# Patient Record
Sex: Female | Born: 1950
Health system: Southern US, Community
[De-identification: ages and names within clinical notes are randomized; demographics above are authoritative.]

## PROBLEM LIST (undated history)

## (undated) DIAGNOSIS — I7300 Raynaud's syndrome without gangrene: Secondary | ICD-10-CM

## (undated) DIAGNOSIS — E041 Nontoxic single thyroid nodule: Secondary | ICD-10-CM

## (undated) HISTORY — PX: DILATION AND CURETTAGE OF UTERUS: SHX78

## (undated) HISTORY — DX: Raynaud's syndrome without gangrene: I73.00

## (undated) HISTORY — DX: Nontoxic single thyroid nodule: E04.1

---

## 1975-09-05 HISTORY — PX: TONSILLECTOMY: SUR1361

## 1986-09-04 HISTORY — PX: TUBAL LIGATION: SHX77

## 1987-09-05 HISTORY — PX: BREAST ENHANCEMENT SURGERY: SHX7

## 1998-04-20 ENCOUNTER — Other Ambulatory Visit: Admission: RE | Admit: 1998-04-20 | Discharge: 1998-04-20 | Payer: Self-pay | Admitting: Obstetrics & Gynecology

## 1998-09-02 ENCOUNTER — Other Ambulatory Visit: Admission: RE | Admit: 1998-09-02 | Discharge: 1998-09-02 | Payer: Self-pay | Admitting: Internal Medicine

## 1999-05-05 ENCOUNTER — Other Ambulatory Visit: Admission: RE | Admit: 1999-05-05 | Discharge: 1999-05-05 | Payer: Self-pay | Admitting: Obstetrics & Gynecology

## 1999-07-15 ENCOUNTER — Ambulatory Visit (HOSPITAL_COMMUNITY): Admission: RE | Admit: 1999-07-15 | Discharge: 1999-07-15 | Payer: Self-pay | Admitting: Obstetrics & Gynecology

## 1999-07-15 ENCOUNTER — Encounter (INDEPENDENT_AMBULATORY_CARE_PROVIDER_SITE_OTHER): Payer: Self-pay

## 1999-11-11 ENCOUNTER — Encounter: Payer: Self-pay | Admitting: Obstetrics & Gynecology

## 1999-11-11 ENCOUNTER — Encounter: Admission: RE | Admit: 1999-11-11 | Discharge: 1999-11-11 | Payer: Self-pay | Admitting: Obstetrics & Gynecology

## 2000-08-23 ENCOUNTER — Other Ambulatory Visit: Admission: RE | Admit: 2000-08-23 | Discharge: 2000-08-23 | Payer: Self-pay | Admitting: Obstetrics & Gynecology

## 2001-10-25 ENCOUNTER — Encounter: Admission: RE | Admit: 2001-10-25 | Discharge: 2001-10-25 | Payer: Self-pay | Admitting: Obstetrics & Gynecology

## 2001-10-25 ENCOUNTER — Encounter: Payer: Self-pay | Admitting: Obstetrics & Gynecology

## 2001-12-09 ENCOUNTER — Other Ambulatory Visit: Admission: RE | Admit: 2001-12-09 | Discharge: 2001-12-09 | Payer: Self-pay | Admitting: Obstetrics & Gynecology

## 2004-08-31 ENCOUNTER — Inpatient Hospital Stay (HOSPITAL_COMMUNITY): Admission: AD | Admit: 2004-08-31 | Discharge: 2004-08-31 | Payer: Self-pay | Admitting: Obstetrics & Gynecology

## 2004-10-04 ENCOUNTER — Other Ambulatory Visit: Admission: RE | Admit: 2004-10-04 | Discharge: 2004-10-04 | Payer: Self-pay | Admitting: Obstetrics & Gynecology

## 2009-04-06 ENCOUNTER — Encounter (INDEPENDENT_AMBULATORY_CARE_PROVIDER_SITE_OTHER): Payer: Self-pay | Admitting: Interventional Radiology

## 2009-04-06 ENCOUNTER — Encounter: Admission: RE | Admit: 2009-04-06 | Discharge: 2009-04-06 | Payer: Self-pay | Admitting: Surgery

## 2009-04-06 ENCOUNTER — Other Ambulatory Visit: Admission: RE | Admit: 2009-04-06 | Discharge: 2009-04-06 | Payer: Self-pay | Admitting: Interventional Radiology

## 2009-07-20 ENCOUNTER — Encounter: Admission: RE | Admit: 2009-07-20 | Discharge: 2009-07-20 | Payer: Self-pay | Admitting: Obstetrics & Gynecology

## 2009-10-05 HISTORY — PX: ENDOMETRIAL ABLATION: SHX621

## 2009-11-18 ENCOUNTER — Encounter: Admission: RE | Admit: 2009-11-18 | Discharge: 2009-11-18 | Payer: Self-pay | Admitting: Surgery

## 2010-05-28 IMAGING — US US SOFT TISSUE HEAD/NECK
1 series · 14 of 25 positions shown · non-contrast
Comparison: None

CLINICAL DATA: Palpable left thyroid nodule.

THYROID ULTRASOUND
TECHNIQUE: Ultrasound examination of the thyroid gland and
adjacent soft tissues was performed.

[Series 1: us soft tissue head/neck · 0.04mm/px · 14 of 39 slices shown]
[im 1/39]
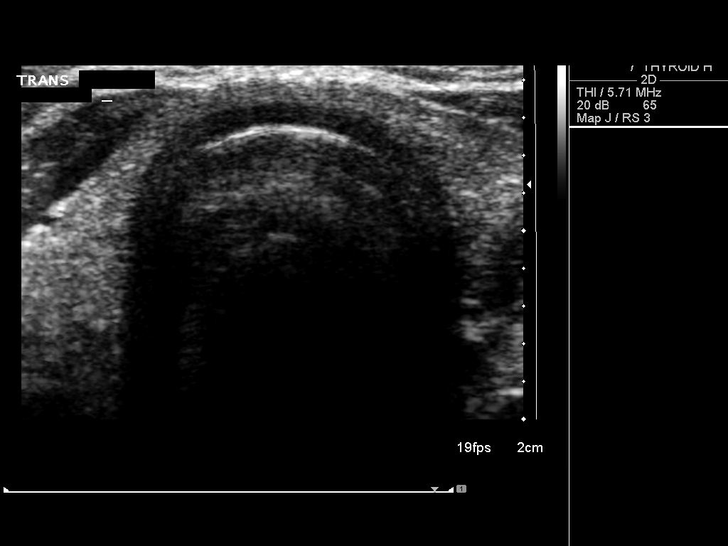
[im 4/39]
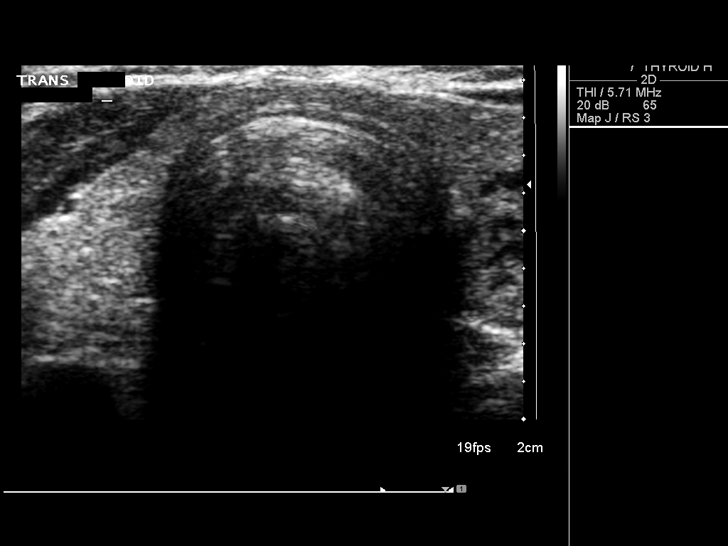
[im 7/39]
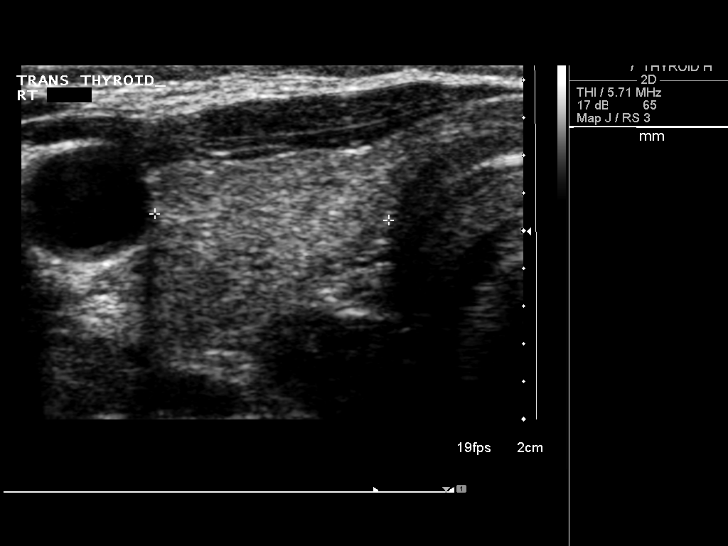
[im 10/39]
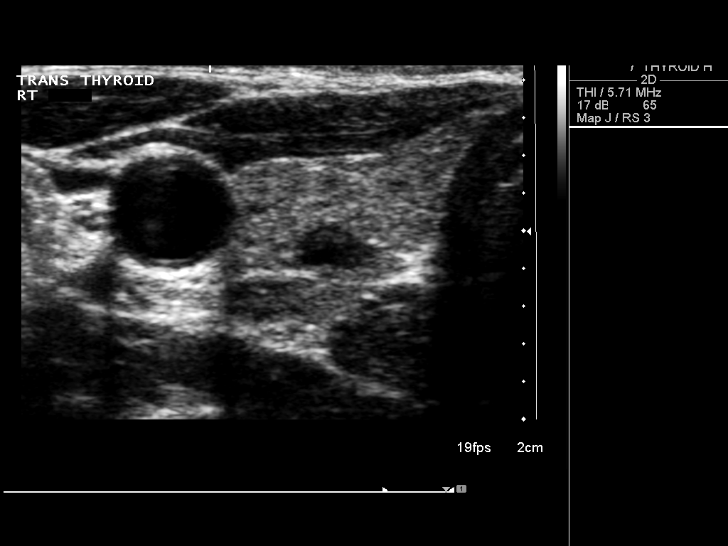
[im 13/39]
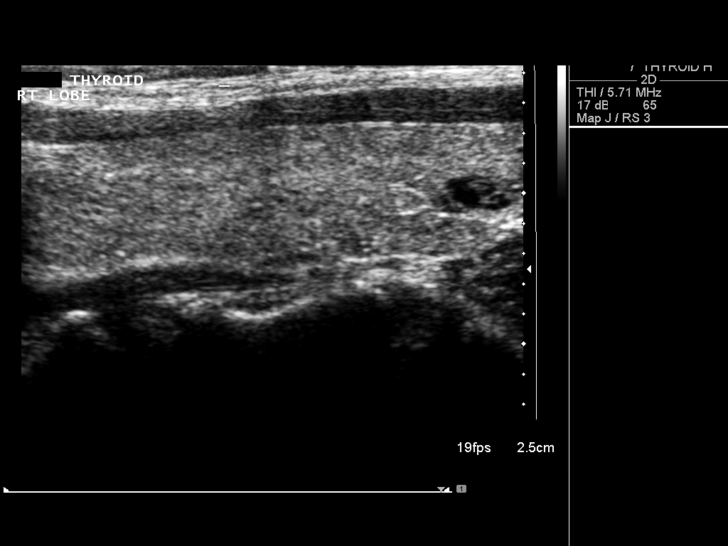
[im 15/39]
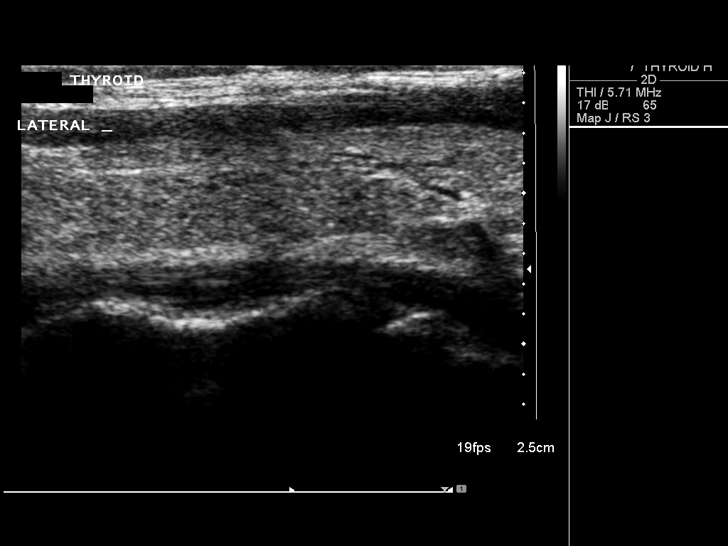
[im 18/39]
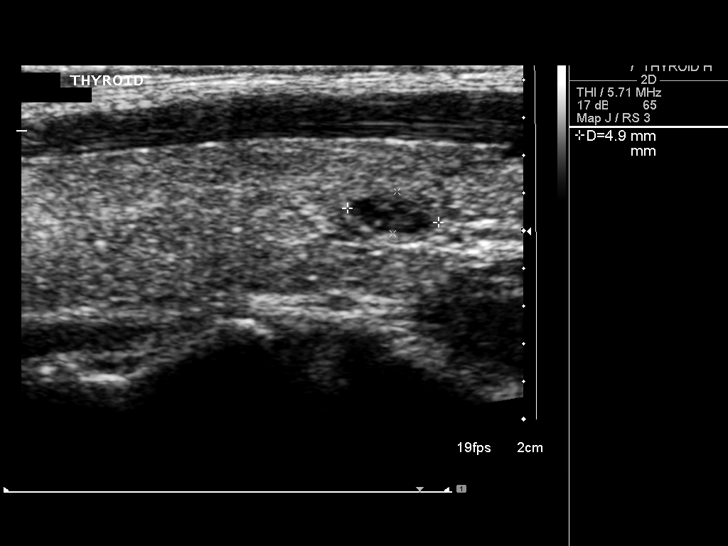
[im 21/39]
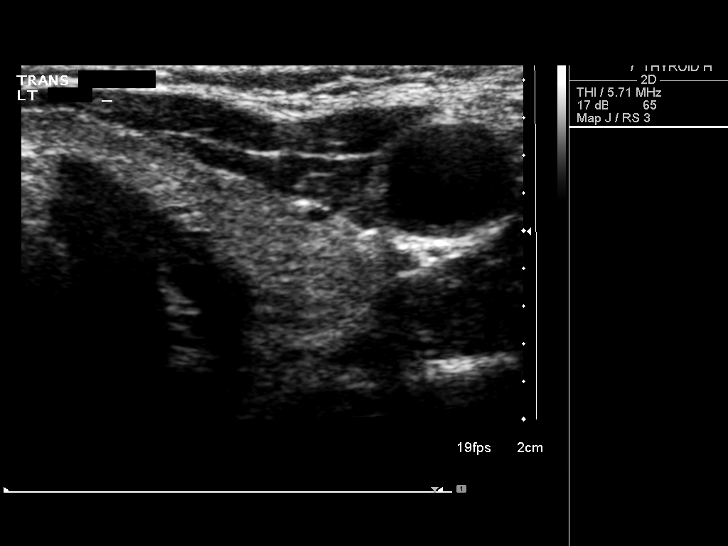
[im 24/39]
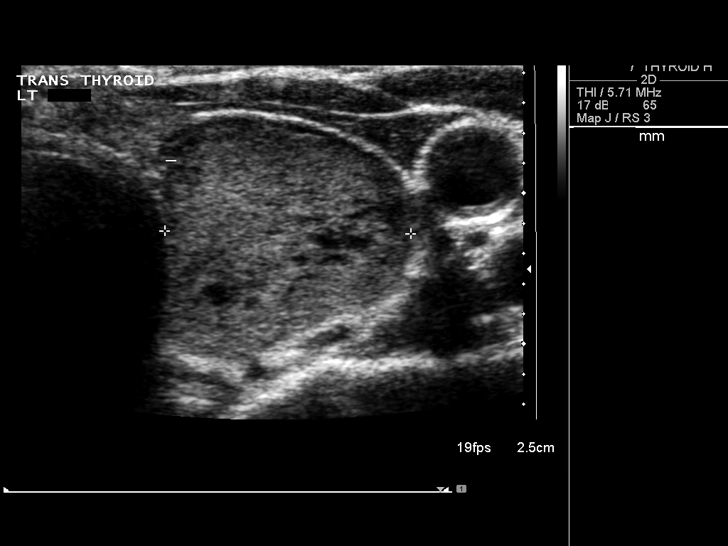
[im 26/39]
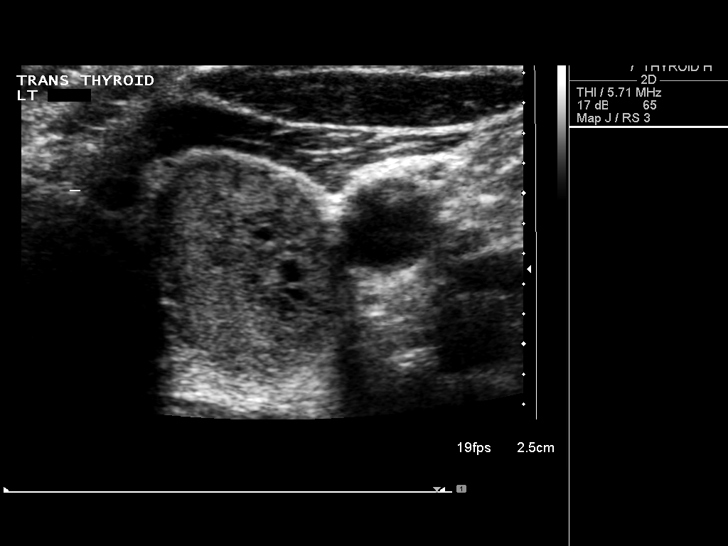
[im 29/39]
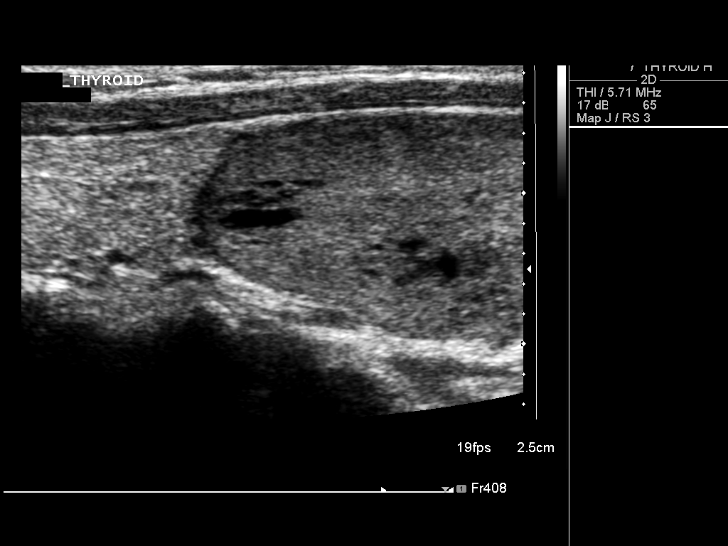
[im 32/39]
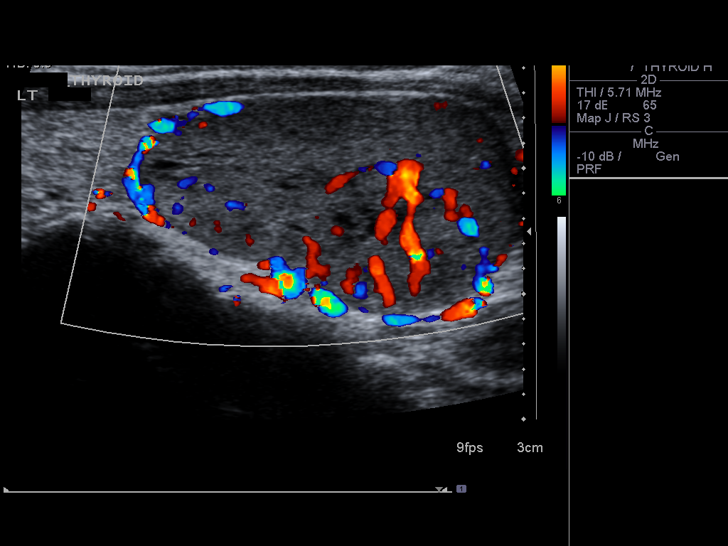
[im 35/39]
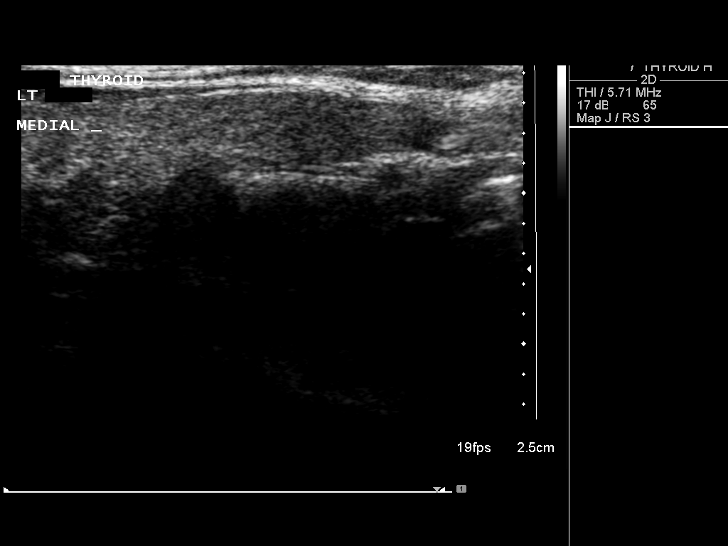
[im 39/39]
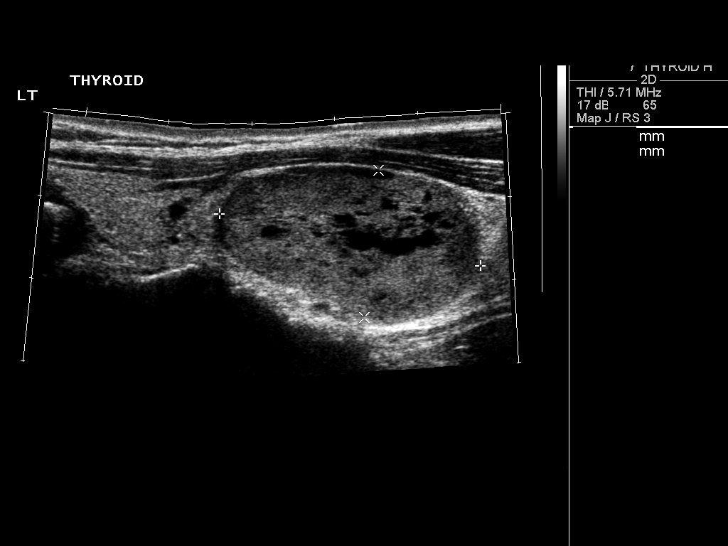

[14 of 25 positions shown; findings below may reference images not displayed]

FINDINGS: Right lobe:  4.9 x 1.1 x 1.2 cm.

Left lobe:  5.1 x 1.8 x 1.8 cm.

Thyroid isthmus:  0.2 cm.

A dominant ovoid nodule is present in the inferior aspect of the
left lobe measuring approximately 3.2 x 1.8 x 1.8 cm.  This nodule
is predominately solid and contains some areas of internal partial
cystic degeneration.  No internal microcalcifications are
identified.  Hypervascular color Doppler flow is seen around the
periphery of the nodule.

A tiny 5 mm hypoechoic nodules present in the inferior aspect of
the right lobe.
IMPRESSION: Dominant thyroid nodule in left lobe measuring 3.2 x 1.8 x 1.8 cm.
This nodule is predominately solid with some areas of internal
cystic degeneration present.

## 2010-05-28 IMAGING — US US BIOPSY
1 series · 5 of 5 positions shown · non-contrast
Comparison: Diagnostic ultrasound performed on same date

CLINICAL DATA: Dominant solid nodule in left lobe of thyroid gland.

ULTRASOUND-GUIDED NEEDLE ASPIRATE BIOPSY, LEFT LOBE OF THYROID
The above procedure was discussed with the patient and written
informed consent was obtained.

[Series 1: us biopsy · 5 acquisitions, 5 frames shown]
[im 1/5]
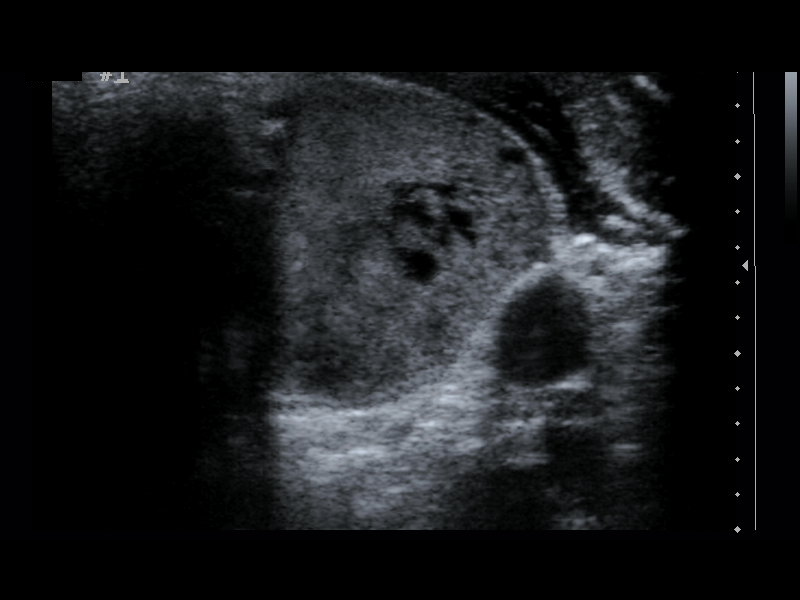
[im 2/5]
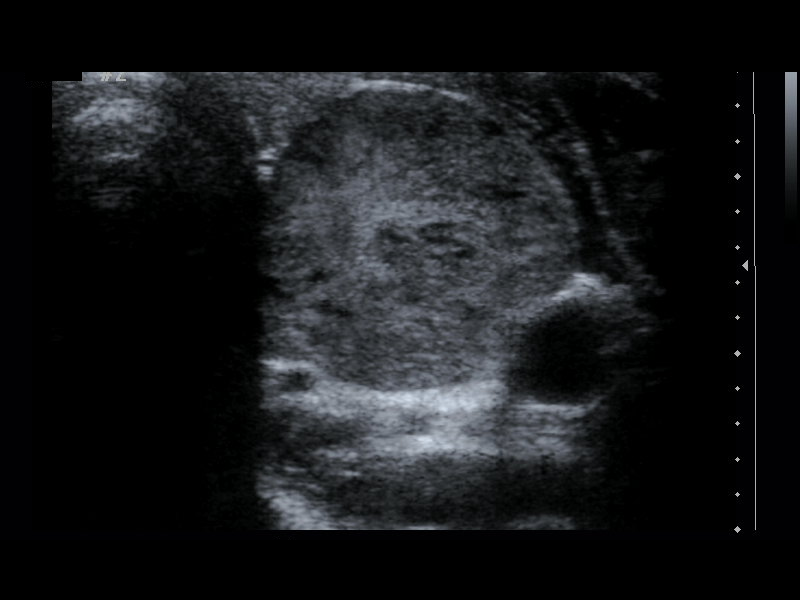
[im 3/5]
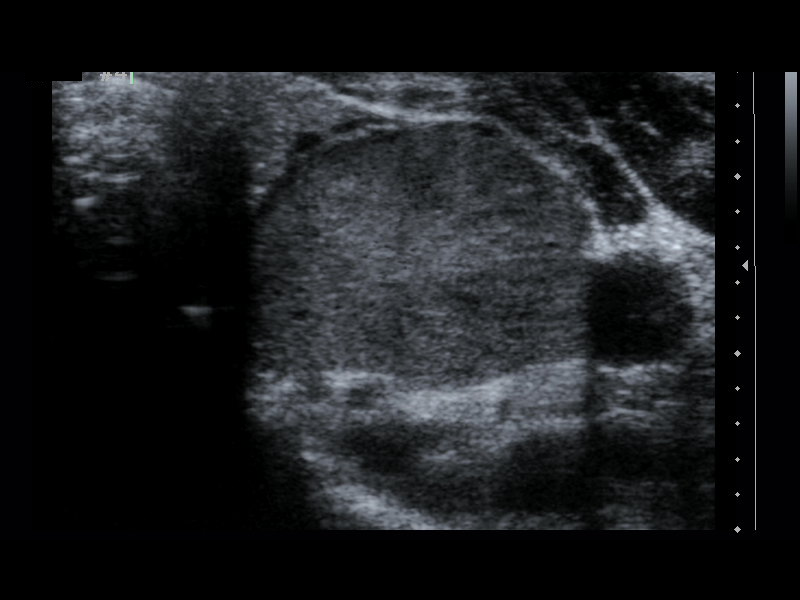
[im 4/5]
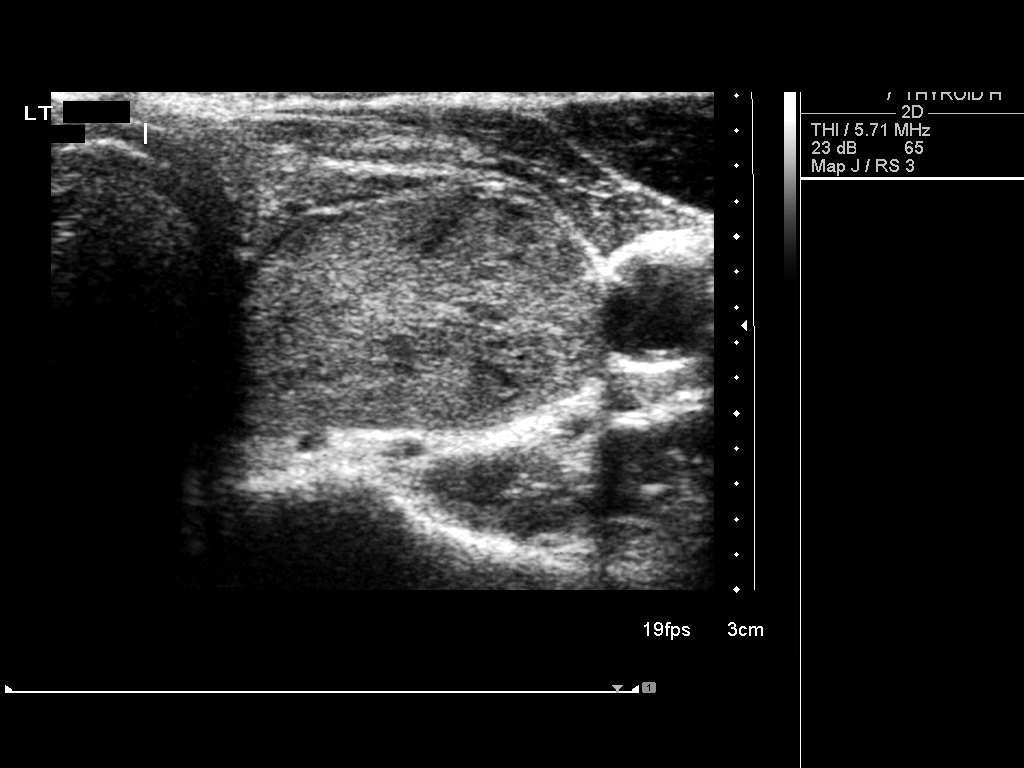
[im 5/5]
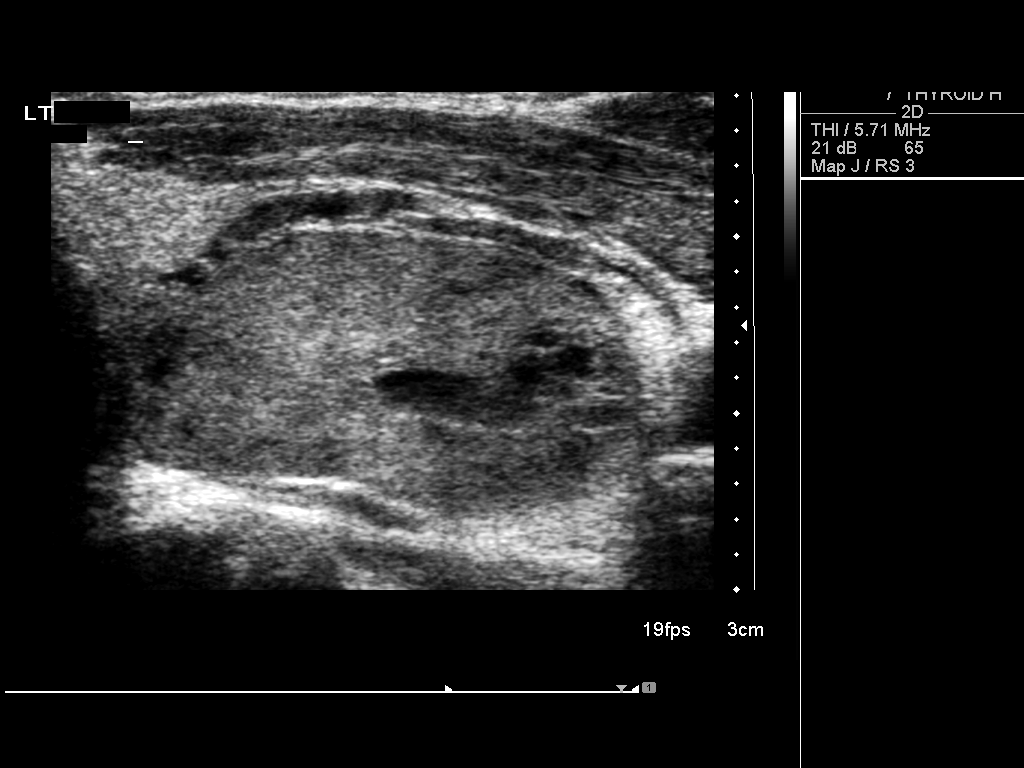

[5 of 5 positions shown; findings below may reference images not displayed]

FINDINGS: Ultrasound was performed to localize and mark an adequate
site for the biopsy.  The patient was then prepped and draped in a
normal sterile fashion.  Local anesthesia was provided with 1%
lidocaine.  Using direct ultrasound guidance, 4 passes were made
using 25 gauge needles into the nodule within the left lobe of the
thyroid.  Ultrasound was used to confirm needle placements on all
occasions.  Specimens were sent to Pathology for analysis.  Post
procedural imaging demonstrated no hematoma or immediate
complication.  The patient tolerated the procedure well.
IMPRESSION: Ultrasound guided needle aspirate biopsy performed of left thyroid
nodule.

## 2011-01-20 NOTE — Op Note (Signed)
[facility]  Patient:    Julie Andersen                        MRN: [id number] Proc. Date: 07/15/99 Adm. Date:  [id number] Attending:  Minette Headland                           Operative Report  PREOPERATIVE DIAGNOSES:       Menometrorrhagia; dysfunctional uterine bleeding unresponsive to hormonal therapy; uterine enlargement.  POSTOPERATIVE DIAGNOSES:      Menometrorrhagia; dysfunctional uterine bleeding unresponsive to hormonal therapy; normal-size uterine cavity and no evidence of  intraoperative or intrauterine of endometrial abnormality.  OPERATIVE PROCEDURE:          Hysteroscopy; dilatation and curettage.  SURGEON:                      Freddy Finner, M.D.  ANESTHESIA:                   Managed intravenous sedation and paracervical block.  ESTIMATED INTRAOPERATIVE BLOOD LOSS:  20 cc.  INTRAOPERATIVE SORBITOL DEFICIT:  50 cc.  INDICATIONS:                  Patient is a 60 year old white married female, gravida 2, para 2, who has had several months history of severe menorrhagia and  intermenstrual spotting.  She has been treated with oral contraceptives in elevated doses with some decrease in flow but without resolution of bleeding.  She is now admitted for a hysteroscopy/D&C.  INTRAOPERATIVE FINDINGS:      The uterus sounded to 8 cm.  There was no intracavitary abnormality.  The overall size of the uterus is approximately 9 weeks.  DESCRIPTION OF PROCEDURE:     Patient was admitted on the morning of surgery, brought to the operating room and there placed under adequate intravenous sedation. She was placed in the dorsal lithotomy position using the [employer] system.  Betadine prep of perineum and vagina was carried out.  Sterile drapes were applied. Bivalve speculum was introduced and cervix was anesthetized on the anterior lip  with 0.5 cc of 1% plain lidocaine.  Cervix was grasped with a single-tooth tenaculum.   Paracervical block was placed using approximately 5 cc of 1% plain lidocaine injected at 4 and 8 oclock in the vaginal fornices.  Cervix and uterus were sounded to 8 cm.  Progressive dilatation of the cervix was accomplished with Pratts to 23.  [facility] diagnostic scope was then introduced, using sorbitol as the distending medium.  No visible evidence of intracavitary abnormality was identified.  Thorough curettage with a Heaney curette and exploration with Randall stone forceps were used to collect the endometrial sample.  Reinspection revealed no new findings.  The procedure was terminated at this point.  All instruments ere removed.  The patient was taken to recovery in good condition.  She will be discharged with routine outpatient surgical instructions, for followup in the office in one week.  She is to call for fever, severe pain or heavy bleeding.  She is to take Vicoprofen as needed for postoperative pain.  She is o continue with [facility]. DD:  07/15/99 TD:  07/15/99 Job: [id number]

## 2011-06-28 ENCOUNTER — Telehealth: Payer: Self-pay | Admitting: Pulmonary Disease

## 2011-06-28 NOTE — Telephone Encounter (Signed)
Received 280 pages from [facility]. Forwarded to Dr. Kriste Basque for review. 06/28/11-ar

## 2011-07-24 ENCOUNTER — Encounter: Payer: Self-pay | Admitting: Pulmonary Disease

## 2011-07-24 ENCOUNTER — Ambulatory Visit (INDEPENDENT_AMBULATORY_CARE_PROVIDER_SITE_OTHER)
Admission: RE | Admit: 2011-07-24 | Discharge: 2011-07-24 | Disposition: A | Discharge status: Home / Self Care | Payer: PRIVATE HEALTH INSURANCE | Source: Ambulatory Visit | Attending: Pulmonary Disease | Admitting: Pulmonary Disease

## 2011-07-24 ENCOUNTER — Ambulatory Visit (INDEPENDENT_AMBULATORY_CARE_PROVIDER_SITE_OTHER): Payer: PRIVATE HEALTH INSURANCE | Admitting: Pulmonary Disease

## 2011-07-24 VITALS — BP 124/68 | HR 58 | Temp 97.0°F | Ht 68.0 in | Wt 120.6 lb

## 2011-07-24 DIAGNOSIS — K635 Polyp of colon: Secondary | ICD-10-CM | POA: Insufficient documentation

## 2011-07-24 DIAGNOSIS — Z0000 Encounter for general adult medical examination without abnormal findings: Secondary | ICD-10-CM

## 2011-07-24 DIAGNOSIS — K5730 Diverticulosis of large intestine without perforation or abscess without bleeding: Secondary | ICD-10-CM

## 2011-07-24 DIAGNOSIS — Z8639 Personal history of other endocrine, nutritional and metabolic disease: Secondary | ICD-10-CM | POA: Insufficient documentation

## 2011-07-24 DIAGNOSIS — I7300 Raynaud's syndrome without gangrene: Secondary | ICD-10-CM

## 2011-07-24 DIAGNOSIS — Z862 Personal history of diseases of the blood and blood-forming organs and certain disorders involving the immune mechanism: Secondary | ICD-10-CM

## 2011-07-24 DIAGNOSIS — D126 Benign neoplasm of colon, unspecified: Secondary | ICD-10-CM

## 2011-07-24 DIAGNOSIS — M79609 Pain in unspecified limb: Secondary | ICD-10-CM

## 2011-07-24 DIAGNOSIS — H9190 Unspecified hearing loss, unspecified ear: Secondary | ICD-10-CM | POA: Insufficient documentation

## 2011-07-24 DIAGNOSIS — E538 Deficiency of other specified B group vitamins: Secondary | ICD-10-CM | POA: Insufficient documentation

## 2011-07-24 DIAGNOSIS — M79604 Pain in right leg: Secondary | ICD-10-CM | POA: Insufficient documentation

## 2011-07-24 NOTE — Progress Notes (Signed)
Subjective:    Patient ID: Julie Andersen, female    DOB: 20-Jul-1951, 60 y.o.   MRN: [id number]  HPI   << Julie Andersen >>  26 y/o WF who is the wife of Julie Andersen. 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  ~  July 24, 2011:  Presents for new pt eval and CPX: her CC= pain in her right hip & burning discomfort in her right leg for several months that seems to be persistent & sl progressive; exam shows some sl decr ROM in right hip but neg SLR & neuro-vasc exam is wnl; she will need XRays of hip & lumbar spine to start & likely an MRI of her L/S spine and she would like to pursue this via Ortho (she has seen Julie Andersen yrs ago); NOT: she fell down some stairs in 1993 w/ fx of sacrum & had some similar sounding symptoms at that time; she has also had a Neuro work up more recently from [facility] due to leg symptoms...    Old records reviewed & summarized in the prob list below >>          Problem List:   HEARING LOSS>> she saw Julie Andersen 6/10 w/ hearing loss & they continue to follow  THYROID NODULE>> followed by Julie Andersen for CCS on SYNTHROID 10mcg/d for suppression. ~  Thyroid Sonar 8/10 showed dominant thyroid nodule in left lobe measuring 3.2 x 1.8 x 1.8 cm- predominately solid with some areas of internal cystic degeneration. ~  Needle Biospy 8/10 showed FOLLICULAR EPITHELIUM IN A PREDOMINANTLY MICROFOLLICULAR PATTERN. ~  ThyroidSonar 3/11 showed slight decrease in size of dominant solid nodule in the lower pole of the left lobe... ~  TFTs 11/12 done at her work  Hx GASTRITIS>> she had an EGD by Julie Andersen 1/06 for epigastric symptoms & it showed antritis & duodenitis, neg CLO, treated w/ PPI meds...  COLON POLYPS>> she has had colonoscopies every 54yrs due to hx adenomatous polyps... ~  Last colonoscopy 10/11 by Julie Andersen showed divertics, hemorrhoids, & 4 diminutive polyps==> tubular adenomas  GYN>> followed by Julie Andersen w/ 2 pregnancies, hx fibroids,  ovarian cyst, endometrial polyps...  PAIN in RIGHT HIP & BURNING in RIGHT LEG>> she notes this is a relatively new symptom for last few months, right hip hurts to roll over at night, and burning discomfort all the way down her leg- this appears to be persistant & sl progressive, denies back or buttock discomfort ==> she will need XRays of hip & Lumbar spine to start, and likely need MRI L/S spine for further evaluation (she will decide re: calling Julie Andersen et al for this work-up)... ~  [facility]ail records from 1993 show that she fractured her sacrum after a fall down some steps & she had some tingling in her legs at that time  ?Hx SPINAL STENOSIS>> she reports remote Ortho eval by Julie Andersen w/ Dx of spinal stenosis- but records showed XRays for LBP eval 1992 w/ some degenerative changes w/ narrowing at L4-5 & L5-S1 & he diagnosed a lumbar strain, treated w/ anti-inflamm & musc relaxers & improved.  EPISODE of FALLING>> unexplained fall (leg collapsed) 3/11 & evaluated by Julie Andersen & pt relates that he found low B12 level (around 100 by her recollection) & decided to treat her w/ B12 shots which she says she stopped about in the summer of 2012 (we do not have notes from Julie Andersen to review); she will contact his Andersen regarding restart of the B12 injections.Marland KitchenMarland Kitchen  Hx RAYNAUD's PHENOMENON>> she saw Julie Andersen 10/09 for Rheum & collagen-vasc screen was essentially neg; he has treated w/ CCB in the winter months if needed... ~ she saw Julie Andersen in 1992 w/ concern for Raynaud's in relation to her silicone breast implants & Rx w/ Nifedipine- but developed HAs on this med...  ACNE ROSACEA>> on DOXYCYCLINE per Dr. Amy Andersen...  Hx B12 DEFICIENCY>> prev eval by Julie Andersen w/ low B12 found & she was treated w/ B12 shots (initially weekly, then monthly, then every other month- but she stopped several months ago)... ~  Labs done by Julie Andersen at work 11/12 showed B12 level = 371 (211-911)  HEALTH MAINTENANCE: ~  General Med:   She will get CBC & CMet at work; FLP 11/12 is ok w/ TChol 178, TG 70, HDL 83, LDL 81 ~  GI:  Julie Andersen>  ~  GYN:  Julie Andersen> she gets her mammograms regularly & BMDs have all been WNL (last 2/12 w/ TScores +3.1 in spine, and +2.7 in left FemNeck. ~  Immunizations:  She is up to date w/ Flu shots & TDAP thru Julie Andersen; we discussed the Shingle's vaccine & she will decide; she will be due for the Pneumonia vaccine at age 76...   Past Surgical History  Procedure Date  . Tubal ligation 1988  . Breast enhancement surgery 1989  . Tonsillectomy 1977  . Dilation and curettage of uterus     x 3  . Endometrial ablation 10/2009    Outpatient Encounter Prescriptions as of 07/24/2011  Medication Sig Dispense Refill  . doxycycline (VIBRAMYCIN) 100 MG capsule Take 100 mg by mouth 2 (two) times daily.        Marland Kitchen estradiol-norethindrone (COMBIPATCH) 0.05-0.14 MG/DAY Place 1 patch onto the skin 2 (two) times a week.        . levothyroxine (SYNTHROID, LEVOTHROID) 75 MCG tablet Take 75 mcg by mouth daily.          No Known Allergies   Current Medications, Allergies, Past Medical History, Past Surgical History, Family History, and Social History were reviewed in Julie Andersen record.    Review of Systems    Constitutional:  Denies F/C/S, anorexia, unexpected weight change. HEENT:  No HA, visual changes, earache, nasal symptoms, sore throat, hoarseness. Resp:  No cough, sputum, hemoptysis; no SOB, tightness, wheezing. Cardio:  No CP, palpit, DOE, orthopnea, edema. GI:  Denies N/V/D/C or blood in stool; no reflux, abd pain, distention, or gas. GU:  No dysuria, freq, urgency, hematuria, or flank pain. MS:  +pain in right hip w/ sl decr ROM; neg SLR, neuro intact, no neck or back pain. Neuro:  No tremors, seizures, dizziness, syncope, weakness, numbness, gait abn. Skin:  No suspicious lesions or skin rash. Heme:  No adenopathy, bruising, bleeding. Psyche: Denies confusion, sleep  disturbance, hallucinations, anxiety, depression.   Objective:   Physical Exam    Vital Signs:  Reviewed...  General:  WD, Thin, 60 y/o WF in NAD; alert & oriented; pleasant & cooperative... HEENT:  [place]/AT; Conjunctiva- pink, Sclera- nonicteric, EOM-wnl, PERRLA, EACs-clear, TMs-wnl; NOSE-clear; THROAT-clear & wnl. Neck:  Supple w/ full ROM; no JVD; normal carotid impulses w/o bruits; no thyromegaly & could not palp nodule; no lymphadenopathy. Chest:  Clear to P & A; without wheezes, rales, or rhonchi heard. Heart:  Regular Rhythm; norm S1 & S2 without murmurs, rubs, or gallops detected. Abdomen:  Soft & nontender- no guarding or rebound; normal bowel sounds; no organomegaly or masses palpated. Ext:  Sl  decr ROM right hip; otherw exam neg w/o obvious arthritic changes; no varicose veins, venous insuffic, or edema;  Pulses intact w/o bruits. Neuro:  CNs II-XII intact; motor testing normal; sensory testing normal; gait normal & balance OK. Derm:  No lesions noted; no rash etc, she has hx acne rosacea controlled w/ Doxy... Lymph:  No cervical, supraclavicular, axillary, or inguinal adenopathy palpated.  EKG:  SBrady- rate 54/min, sl IVCD & NSSTTWA, no acute changes & no old tracings for comparison...  CXR:  Heart is normal size, lungs clear, mild degen changes in TSpine w/ lat osteophytes, breast implants w/ capsular calcific...  Pt brought labs from Julie Andersen:  Reviewed w/ pt...   Assessment & Plan:   CPX>  Her CC is the discomfort in her right hip area & burning sensation in her right leg; this needs further evaluation & I suggest we proceed w/ either Ortho eval (eg Julie Andersen) or consider Neuro eval f/u w/ DrLove; she will need XRays of right hip & L/S spine & consideration of additional tests (MRI L/S spine & poss EMG/NCV)...  Hearing loss> Evaluated & followed by Gayla Medicus...  Thyroid Nodule>  Evaluated & followed by Julie Andersen, on Synthroid suppression & nodule was smaller on her  last scan...  GI> Hx Gastritis, Divertics, Polyps, Hems>  Followed by [facility] & up to date on screening...  GYN>  Followed by Julie Andersen & similarly up to date...  ORTHO/ NEURO>> Hx falling w/ sacral fx in the past, right hip discomfort, right leg discomfort, ?"spinal stenosis" in the past> ?etiology & needs further eval as outlined...  Hx Raynaud's>  Eval & Rx from Drs Phylliss Bob & Dareen Piano in the past...  B12 Deficiency> she's been off the B12 shots & recent B12 level = 371; needs f/u from [facility] & we will call for his notes to review.Marland KitchenMarland Kitchen

## 2011-07-24 NOTE — Patient Instructions (Signed)
Julie Andersen, it was nice meeting you today...  Today we did your baseline CXR & EKG...    Please call the [facility] in a few days for your results...    Dial [id number] & when prompted enter your patient number followed by the # symbol...    Your patient number is:  [id number]#  When you next have labs at DrNeal's office please do a CBC w/ diff & CMet (and send copies to me please)...  We should proceed w/ further evaluation of your right hip & leg symptoms via an Orthopedist or w/ DrLove, Neurology.  Let me know if you need any pain meds or if I can be of further assistance in any way.Marland KitchenMarland Kitchen

## 2011-08-25 ENCOUNTER — Telehealth: Payer: Self-pay | Admitting: *Deleted

## 2011-08-25 MED ORDER — LEVOTHYROXINE SODIUM 75 MCG PO TABS: 75.0000 ug | ORAL_TABLET | Freq: Every day | ORAL | Status: AC

## 2011-08-25 NOTE — Telephone Encounter (Signed)
Last ov with SN 11.19.12.  Verified medication requested.  90 days to [facility].

## 2011-09-05 HISTORY — PX: AUGMENTATION MAMMAPLASTY: SUR837

## 2011-10-03 ENCOUNTER — Telehealth: Payer: Self-pay | Admitting: Pulmonary Disease

## 2011-10-03 NOTE — Telephone Encounter (Signed)
Per Leigh she does not recall seeing any forms from this facility regarding the patient. I have LMOM for Julie Andersen to return triage call.

## 2011-10-04 NOTE — Telephone Encounter (Signed)
LMTCB for Julie Andersen 

## 2011-10-06 NOTE — Telephone Encounter (Signed)
lmomtcb for tracy

## 2011-10-09 NOTE — Telephone Encounter (Signed)
I spoke with [employer] and they have received the release on 10-03-11 and it is in process. They have 7-10 business days to complete the requests. I have LMTCBx1 with [facility] to advise. .sing

## 2011-10-10 NOTE — Telephone Encounter (Signed)
lmomtcb x 1 for marilyn at [facility].

## 2011-10-11 NOTE — Telephone Encounter (Signed)
lmomtcb x 1 for Julie Andersen or [employer]. [facility]

## 2011-10-12 NOTE — Telephone Encounter (Signed)
Per protocol I will sign off on note and await call back. Carron Curie, CMA

## 2011-11-22 ENCOUNTER — Telehealth: Payer: Self-pay | Admitting: Pulmonary Disease

## 2011-11-22 NOTE — Telephone Encounter (Signed)
I spoke with pt and she states that she is going to have her breast implants replaced and the surgery is scheduled for 12/21/11 by a doctor in [place]. She could not remember his name though. Pt is needing to have surgery clearance. 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 Please advise Dr. Kriste Basque, thanks

## 2011-11-22 NOTE — Telephone Encounter (Signed)
Called and lmomtcb for pt ---offered appt for surgical clearance 12-12-11 at 12.

## 2011-11-23 NOTE — Telephone Encounter (Signed)
Spoke with pt and scheduled her appt for 12-12-11 at 12. Nothing further needed per pt.

## 2011-12-12 ENCOUNTER — Encounter: Payer: Self-pay | Admitting: Pulmonary Disease

## 2011-12-12 ENCOUNTER — Ambulatory Visit (INDEPENDENT_AMBULATORY_CARE_PROVIDER_SITE_OTHER): Payer: PRIVATE HEALTH INSURANCE | Admitting: Pulmonary Disease

## 2011-12-12 VITALS — BP 100/62 | HR 50 | Temp 97.0°F | Ht 68.0 in | Wt 120.4 lb

## 2011-12-12 DIAGNOSIS — K5730 Diverticulosis of large intestine without perforation or abscess without bleeding: Secondary | ICD-10-CM

## 2011-12-12 DIAGNOSIS — I7300 Raynaud's syndrome without gangrene: Secondary | ICD-10-CM

## 2011-12-12 DIAGNOSIS — Z01818 Encounter for other preprocedural examination: Secondary | ICD-10-CM

## 2011-12-12 DIAGNOSIS — Z8639 Personal history of other endocrine, nutritional and metabolic disease: Secondary | ICD-10-CM

## 2011-12-12 DIAGNOSIS — Z862 Personal history of diseases of the blood and blood-forming organs and certain disorders involving the immune mechanism: Secondary | ICD-10-CM

## 2011-12-12 DIAGNOSIS — K635 Polyp of colon: Secondary | ICD-10-CM

## 2011-12-12 DIAGNOSIS — D126 Benign neoplasm of colon, unspecified: Secondary | ICD-10-CM

## 2011-12-12 DIAGNOSIS — E538 Deficiency of other specified B group vitamins: Secondary | ICD-10-CM

## 2011-12-12 NOTE — Progress Notes (Signed)
Subjective:    Patient ID: Julie Andersen, female    DOB: 08-May-1951, 61 y.o.   MRN: [id number]  HPI << Julie Andersen >> 60 y/o WF who is the wife of Julie Andersen. 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  ~  July 24, 2011:  Presents for new pt eval and CPX: her CC= pain in her right hip & burning discomfort in her right leg for several months that seems to be persistent & sl progressive; exam shows some sl decr ROM in right hip but neg SLR & neuro-vasc exam is wnl; she will need XRays of hip & lumbar spine to start & likely an MRI of her L/S spine and she would like to pursue this via Ortho (she has seen Julie Andersen yrs ago);  Note> she fell down some stairs in 1993 w/ fx of sacrum & had some similar sounding symptoms at that time; she has also had a Neuro work up more recently from [facility] due to leg symptoms...    Old records reviewed & summarized in the prob list below >> LABS 11/12 from her office:  FLP showed TChol 178, TG 70, HDL 83, LDL 81;  Thyroid panel was WNL w/ TSH=1.05;  VitB12=371;  VitD=23 & asked to supplement VitD OTC...  ~  December 12, 2011:  65mo ROV & Julie Andersen has been doing quite well, here today for pre-op med clearance as she is sched for breast implant replacements by Dr. Ina Andersen in [place] next week;  Julie Andersen daughter is a young Programmer, systems in that same practice;  She tells me that one of her saline&gel implants ruptured several yrs ago and she is going to have them replaced at this time...  Medically she is doing well- no new complaints or concerns & she denies CP, palpit, dizzy/ lightheaded, SOB, edema, etc;  prob list as noted below abstracted from old records supplied during her initial visit w/ [place]...           Problem List:     PROBLEM LIST UPDATED 12/12/11 >>  HEARING LOSS>> she saw Julie Andersen (ENT) 6/10 w/ hearing loss & they continue to follow...  THYROID NODULE>> followed by Julie Andersen for CCS on SYNTHROID  94mcg/d for suppression. ~  Thyroid Sonar 8/10 showed dominant thyroid nodule in left lobe measuring 3.2 x 1.8 x 1.8 cm- predominately solid with some areas of internal cystic degeneration. ~  Needle Biospy 8/10 showed FOLLICULAR EPITHELIUM IN A PREDOMINANTLY MICROFOLLICULAR PATTERN. ~  ThyroidSonar 3/11 showed slight decrease in size of dominant solid nodule in the lower pole of the left lobe... ~  TFTs 11/12 done at her work & TSH=1.05, & full thyroid panel normal as well...  Hx GASTRITIS>> she had an EGD by [facility] 1/06 for epigastric symptoms & it showed antritis & duodenitis, neg CLO, treated w/ PPI meds...  COLON POLYPS>> she has had colonoscopies every 1yrs due to hx adenomatous polyps... ~  Last colonoscopy 10/11 by [facility] showed divertics, hemorrhoids, & 4 diminutive polyps==> tubular adenomas  GYN>> followed by Julie Andersen w/ 2 pregnancies, hx fibroids, ovarian cyst, endometrial polyps...  PAIN in RIGHT HIP & BURNING in RIGHT LEG>> she notes this is a relatively new symptom for last few months, right hip hurts to roll over at night, and burning discomfort all the way down her leg- this appears to be persistant & sl progressive, denies back or buttock discomfort ==> she will need XRays of hip & Lumbar spine  to start, and likely need MRI L/S spine for further evaluation (she will decide re: calling Julie Andersen et al for this work-up)... ~  [facility] records from 1993 show that she fractured her sacrum after a fall down some steps & she had some tingling in her legs at that time; Neuro eval by Julie Andersen in 2011 showed no focal neuro deficits...  ?Hx SPINAL STENOSIS>> she reports remote Ortho eval by Julie Andersen w/ Dx of spinal stenosis- but records showed XRays for LBP eval 1992 w/ some degenerative changes w/ narrowing at L4-5 & L5-S1 & he diagnosed a lumbar strain, treated w/ anti-inflamm & musc relaxers & improved. ~  Neurosurg eval 12/12 by Julie Andersen showed scoliosis & L3-4 retrolisthesis & DDD at  L5-S1 on XRays & he wanted to do an MRI but pt tells me she improved w/ her own exercise program & never required formal PT etc so she cancelled the MRI & feels she is doing satis at present on her exercise program (nubness resolved etc)...  EPISODE of FALLING>> unexplained fall (leg collapsed) 3/11 & evaluated by Julie Andersen & pt relates that he found low B12 level (it was 153)) & decided to treat her w/ B12 shots which she says she stopped about in the summer of 2012; she will contact his office regarding restart of the B12 injections...  ~  No further episodes of falling reported...  Hx RAYNAUD's PHENOMENON>> she saw Julie Andersen 10/09 for Rheum & collagen-vasc screen was essentially neg; he has treated w/ CCB in the winter months if needed... ~ she saw Julie Andersen in 1992 w/ concern for Raynaud's in relation to her silicone breast implants & Rx w/ Nifedipine- but developed HAs on this med...  ACNE ROSACEA>> on DOXYCYCLINE per Julie Andersen...  Hx B12 DEFICIENCY>> prev eval by Julie Andersen w/ low B12 found & she was treated w/ B12 shots (initially weekly, then monthly, then every other month- but she stopped several months ago)... ~  Labs from [facility] 4/11 showed B12 = 153 & he started Julie Andersen on B12 shots as noted... ~  Labs done by Julie Andersen at work 11/12 showed B12 level = 371 (211-911)  HEALTH MAINTENANCE: ~  General Med:  She will get CBC & CMet at work; FLP 11/12 is ok w/ TChol 178, TG 70, HDL 83, LDL 81 ~  GI:  Julie Andersen> last colonoscopy 10/11 w/ several adenomas removed & f/u planned 5 yrs... ~  GYN:  Julie Andersen> she gets her mammograms regularly & BMDs have all been WNL (last 2/12 w/ TScores +3.1 in spine, and +2.7 in left FemNeck. ~  Immunizations:  She is up to date w/ Flu shots & TDAP thru Julie Andersen's office; we discussed the Shingle's vaccine & she will decide; she will be due for the Pneumonia vaccine at age 41...   Past Surgical History  Procedure Date  . Tubal ligation 1988  . Breast enhancement surgery  1989  . Tonsillectomy 1977  . Dilation and curettage of uterus     x 3  . Endometrial ablation 10/2009    Outpatient Encounter Prescriptions as of 12/12/2011  Medication Sig Dispense Refill  . doxycycline (VIBRAMYCIN) 100 MG capsule Take 100 mg by mouth 2 (two) times daily.        Marland Kitchen estradiol-norethindrone (COMBIPATCH) 0.05-0.14 MG/DAY Place 1 patch onto the skin 2 (two) times a week.        . levothyroxine (SYNTHROID, LEVOTHROID) 75 MCG tablet Take 1 tablet (75 mcg total) by mouth daily.  90 tablet  3    No Known Allergies   Current Medications, Allergies, Past Medical History, Past Surgical History, Family History, and Social History were reviewed in [employer] record.    Review of Systems    Constitutional:  Denies F/C/S, anorexia, unexpected weight change. HEENT:  No HA, visual changes, earache, nasal symptoms, sore throat, hoarseness. Resp:  No cough, sputum, hemoptysis; no SOB, tightness, wheezing. Cardio:  No CP, palpit, DOE, orthopnea, edema. GI:  Denies N/V/D/C or blood in stool; no reflux, abd pain, distention, or gas. GU:  No dysuria, freq, urgency, hematuria, or flank pain. MS:  +pain in right hip w/ sl decr ROM; neg SLR, neuro intact, no neck or back pain. Neuro:  No tremors, seizures, dizziness, syncope, weakness, numbness, gait abn. Skin:  No suspicious lesions or skin rash. Heme:  No adenopathy, bruising, bleeding. Psyche: Denies confusion, sleep disturbance, hallucinations, anxiety, depression.   Objective:   Physical Exam    Vital Signs:  Reviewed...  General:  WD, Thin, 61 y/o WF in NAD; alert & oriented; pleasant & cooperative... HEENT:  [place]/AT; Conjunctiva- pink, Sclera- nonicteric, EOM-wnl, PERRLA, EACs-clear, TMs-wnl; NOSE-clear; THROAT-clear & wnl. Neck:  Supple w/ full ROM; no JVD; normal carotid impulses w/o bruits; no thyromegaly & could not palp nodule; no lymphadenopathy. Chest:  Clear to P & A; without wheezes, rales, or  rhonchi heard. Heart:  Regular Rhythm; norm S1 & S2 without murmurs, rubs, or gallops detected. Abdomen:  Soft & nontender- no guarding or rebound; normal bowel sounds; no organomegaly or masses palpated. Ext:  Sl decr ROM right hip; otherw exam neg w/o obvious arthritic changes; no varicose veins, venous insuffic, or edema;  Pulses intact w/o bruits. Neuro:  CNs II-XII intact; motor testing normal; sensory testing normal; gait normal & balance OK. Derm:  No lesions noted; no rash etc, she has hx acne rosacea controlled w/ Doxy... Lymph:  No cervical, supraclavicular, axillary, or inguinal adenopathy palpated.  EKG:  SBrady- rate 54/min, sl IVCD & NSSTTWA, no acute changes & no old tracings for comparison... CXR:  Heart is normal size, lungs clear, mild degen changes in TSpine w/ lat osteophytes, breast implants w/ capsular calcific... Pt brought labs from Julie Andersen's office:  Reviewed w/ pt...   Assessment & Plan:   CPX 11/12>  Her CC is the discomfort in her right hip area & burning sensation in her right leg; this needs further evaluation & I suggest we proceed w/ either Ortho eval (eg Julie Andersen) or consider Neuro eval f/u w/ DrLove; she will need XRays of right hip & L/S spine & consideration of additional tests (MRI L/S spine & poss EMG/NCV)==> she saw Julie Andersen for NS & he wanted to do MRI but her symptoms resolved w/ exercise & is much improved at present...  Hearing loss> Evaluated & followed by [facility]...  Thyroid Nodule>  Evaluated & followed by Julie Andersen, on Synthroid suppression & nodule was smaller on her last scan...  GI> Hx Gastritis, Divertics, Polyps, Hems>  Followed by [facility] & up to date on screening...  GYN>  Followed by Julie Andersen & similarly up to date...  ORTHO/ NEURO>  Hx falling w/ sacral fx in the past, right hip discomfort, right leg discomfort, ?"spinal stenosis" in the past> ?etiology & needs further eval as outlined, but improved w/ exercise program...  Hx  Raynaud's>  Eval & Rx from Drs Phylliss Bob & Dareen Piano in the past...  B12 Deficiency> she's been off the B12 shots & recent B12 level = 371;  needs f/u from [facility] & we will call for his notes to review...   Patient's Medications  New Prescriptions   No medications on file  Previous Medications   DOXYCYCLINE (VIBRAMYCIN) 100 MG CAPSULE    Take 100 mg by mouth 2 (two) times daily.     ESTRADIOL-NORETHINDRONE (COMBIPATCH) 0.05-0.14 MG/DAY    Place 1 patch onto the skin 2 (two) times a week.     LEVOTHYROXINE (SYNTHROID, LEVOTHROID) 75 MCG TABLET    Take 1 tablet (75 mcg total) by mouth daily.  Modified Medications   No medications on file  Discontinued Medications   No medications on file

## 2011-12-12 NOTE — Patient Instructions (Signed)
Today we updated your med list in our EPIC system...    Continue your current medications the same...  We will fax a copy of this pre-op exam to [facility] as you requested...  Call for any problems or if we can be of service in any way.Marland KitchenMarland Kitchen

## 2011-12-19 ENCOUNTER — Telehealth: Payer: Self-pay | Admitting: Pulmonary Disease

## 2011-12-19 NOTE — Telephone Encounter (Signed)
Will sign off of message.  

## 2011-12-19 NOTE — Telephone Encounter (Signed)
Called and spoke with Julie Andersen and she stated that the forms have not come through yet. She will keep an eye out for them and call if anything is missing.  Will hold message for now.

## 2012-11-01 ENCOUNTER — Other Ambulatory Visit: Payer: Self-pay | Admitting: Neurosurgery

## 2012-11-01 DIAGNOSIS — M47817 Spondylosis without myelopathy or radiculopathy, lumbosacral region: Secondary | ICD-10-CM

## 2012-11-08 ENCOUNTER — Other Ambulatory Visit: Payer: PRIVATE HEALTH INSURANCE

## 2012-11-14 ENCOUNTER — Ambulatory Visit
Admission: RE | Admit: 2012-11-14 | Discharge: 2012-11-14 | Disposition: A | Discharge status: Home / Self Care | Payer: BC Managed Care – PPO | Source: Ambulatory Visit | Attending: Neurosurgery | Admitting: Neurosurgery

## 2012-11-14 DIAGNOSIS — M47817 Spondylosis without myelopathy or radiculopathy, lumbosacral region: Secondary | ICD-10-CM

## 2012-12-13 ENCOUNTER — Encounter: Payer: Self-pay | Admitting: *Deleted

## 2013-08-13 ENCOUNTER — Other Ambulatory Visit: Payer: Self-pay | Admitting: Obstetrics & Gynecology

## 2013-08-13 DIAGNOSIS — R928 Other abnormal and inconclusive findings on diagnostic imaging of breast: Secondary | ICD-10-CM

## 2013-08-20 ENCOUNTER — Ambulatory Visit
Admission: RE | Admit: 2013-08-20 | Discharge: 2013-08-20 | Disposition: A | Discharge status: Home / Self Care | Payer: BC Managed Care – PPO | Source: Ambulatory Visit | Attending: Obstetrics & Gynecology | Admitting: Obstetrics & Gynecology

## 2013-08-20 DIAGNOSIS — R928 Other abnormal and inconclusive findings on diagnostic imaging of breast: Secondary | ICD-10-CM

## 2014-05-01 ENCOUNTER — Other Ambulatory Visit: Payer: Self-pay | Admitting: Obstetrics & Gynecology

## 2014-05-01 DIAGNOSIS — N63 Unspecified lump in unspecified breast: Secondary | ICD-10-CM

## 2014-05-08 ENCOUNTER — Ambulatory Visit
Admission: RE | Admit: 2014-05-08 | Discharge: 2014-05-08 | Disposition: A | Discharge status: Home / Self Care | Payer: BC Managed Care – PPO | Source: Ambulatory Visit | Attending: Obstetrics & Gynecology | Admitting: Obstetrics & Gynecology

## 2014-05-08 ENCOUNTER — Encounter (INDEPENDENT_AMBULATORY_CARE_PROVIDER_SITE_OTHER): Payer: Self-pay

## 2014-05-08 DIAGNOSIS — N63 Unspecified lump in unspecified breast: Secondary | ICD-10-CM

## 2014-07-18 ENCOUNTER — Ambulatory Visit (INDEPENDENT_AMBULATORY_CARE_PROVIDER_SITE_OTHER): Payer: BC Managed Care – PPO

## 2014-07-18 ENCOUNTER — Encounter: Payer: Self-pay | Admitting: Podiatry

## 2014-07-18 ENCOUNTER — Ambulatory Visit (INDEPENDENT_AMBULATORY_CARE_PROVIDER_SITE_OTHER): Payer: BC Managed Care – PPO | Admitting: Podiatry

## 2014-07-18 VITALS — BP 112/62 | HR 60 | Resp 12

## 2014-07-18 DIAGNOSIS — M7751 Other enthesopathy of right foot: Secondary | ICD-10-CM

## 2014-07-18 DIAGNOSIS — R52 Pain, unspecified: Secondary | ICD-10-CM

## 2014-07-18 DIAGNOSIS — M205X1 Other deformities of toe(s) (acquired), right foot: Secondary | ICD-10-CM

## 2014-07-18 NOTE — Progress Notes (Signed)
   Subjective:    Patient ID: Julie Andersen, female    DOB: 07/27/1951, 63 y.o.   MRN: [id number]  HPI 63 year old female presents the office they with her husband for evaluation of right foot pain particularly in the big toe joint. She states that she has had pain in this area for approximate one year and is been about the same. She states that she has most of her pain while trying to bend the toe. She has problems wearing high heels as well as doing Pilates going into the plank position. She believes that she has arthritis in her toe joint. She has tried taking Advil without much resolution in symptoms. Denies any recent injury or trauma to the area. No other complaints at this time.  Review of Systems  All other systems reviewed and are negative.      Objective:   Physical Exam Objective: AAO x3, NAD DP/PT pulses palpable bilaterally, CRT less than 3 seconds Protective sensation intact with Simms Weinstein monofilament, vibratory sensation intact, Achilles tendon reflex intact Tenderness palpation overlying the right foot overlying the dorsal aspect of the first MTPJ mostly laterally. There is no discomfort with range of motion of the MTPJ. MTPJ ROM is limited in dorsiflexion. There is no pain along either the sesamoids plantarly. There is no overlying edema, erythema, increased warmth. There is no pain with vibratory sensation. No pain to the the left foot.  MMT 5/5, ROM WNL except for otherwise stated.  No open lesions. No calf pain with compressions, swelling, warmth, erythema.      Assessment & Plan:  63 year old female with likely right foot hallux limitus/capsulitis. -X-rays were obtained and reviewed with the patient. There appears being all fracture the distal phalanx of the hallux. There is also what appears to be a bipartite sesamoid to the fibular sesamoid versus old injury. -At this time the patient is requesting steroid injection into the area as she has been previously  for her thumb and she states that gives her relief. Under sterile conditions a realizing Betadine preparation a total of 1 mL mixture of dexamethasone and 0.5% Marcaine plain was infiltrated into the first MTPJ over the site of maximal tenderness. Patient tolerated injection well without complications. -Also discussed with the patient possible orthotics to help alleviate her symptoms. She wishes to hold off at this time. -Follow-up as needed. Call the office with any questions, concerns, change in symptoms.

## 2014-08-12 ENCOUNTER — Other Ambulatory Visit: Payer: Self-pay | Admitting: Obstetrics & Gynecology

## 2014-08-12 DIAGNOSIS — N63 Unspecified lump in unspecified breast: Secondary | ICD-10-CM

## 2014-09-11 ENCOUNTER — Ambulatory Visit
Admission: RE | Admit: 2014-09-11 | Discharge: 2014-09-11 | Disposition: A | Discharge status: Home / Self Care | Payer: Managed Care, Other (non HMO) | Source: Ambulatory Visit | Attending: Obstetrics & Gynecology | Admitting: Obstetrics & Gynecology

## 2014-09-11 ENCOUNTER — Other Ambulatory Visit: Payer: Self-pay | Admitting: Obstetrics & Gynecology

## 2014-09-11 DIAGNOSIS — N63 Unspecified lump in unspecified breast: Secondary | ICD-10-CM

## 2015-05-06 ENCOUNTER — Other Ambulatory Visit: Payer: Self-pay | Admitting: Obstetrics & Gynecology

## 2015-05-07 LAB — CYTOLOGY - PAP

## 2015-08-10 ENCOUNTER — Other Ambulatory Visit: Payer: Self-pay

## 2015-08-10 DIAGNOSIS — Z1231 Encounter for screening mammogram for malignant neoplasm of breast: Secondary | ICD-10-CM

## 2015-09-05 HISTORY — PX: BREAST BIOPSY: SHX20

## 2015-09-24 ENCOUNTER — Ambulatory Visit: Payer: Managed Care, Other (non HMO)

## 2015-11-22 ENCOUNTER — Other Ambulatory Visit: Payer: Self-pay | Admitting: Obstetrics & Gynecology

## 2015-11-22 DIAGNOSIS — R928 Other abnormal and inconclusive findings on diagnostic imaging of breast: Secondary | ICD-10-CM

## 2015-11-26 ENCOUNTER — Ambulatory Visit
Admission: RE | Admit: 2015-11-26 | Discharge: 2015-11-26 | Disposition: A | Discharge status: Home / Self Care | Payer: Managed Care, Other (non HMO) | Source: Ambulatory Visit | Attending: Obstetrics & Gynecology | Admitting: Obstetrics & Gynecology

## 2015-11-26 ENCOUNTER — Other Ambulatory Visit: Payer: Self-pay | Admitting: Obstetrics & Gynecology

## 2015-11-26 DIAGNOSIS — N6320 Unspecified lump in the left breast, unspecified quadrant: Secondary | ICD-10-CM

## 2015-11-26 DIAGNOSIS — R928 Other abnormal and inconclusive findings on diagnostic imaging of breast: Secondary | ICD-10-CM

## 2015-12-03 ENCOUNTER — Other Ambulatory Visit: Payer: Managed Care, Other (non HMO)

## 2016-03-29 ENCOUNTER — Ambulatory Visit (HOSPITAL_COMMUNITY)
Admission: RE | Admit: 2016-03-29 | Discharge: 2016-03-29 | Disposition: A | Discharge status: Home / Self Care | Payer: Managed Care, Other (non HMO) | Source: Ambulatory Visit | Attending: Obstetrics & Gynecology | Admitting: Obstetrics & Gynecology

## 2016-03-29 ENCOUNTER — Encounter (INDEPENDENT_AMBULATORY_CARE_PROVIDER_SITE_OTHER): Payer: Self-pay

## 2016-03-29 ENCOUNTER — Other Ambulatory Visit (HOSPITAL_COMMUNITY): Payer: Self-pay | Admitting: Obstetrics & Gynecology

## 2016-03-29 DIAGNOSIS — J189 Pneumonia, unspecified organism: Secondary | ICD-10-CM | POA: Diagnosis present

## 2016-03-29 DIAGNOSIS — R059 Cough, unspecified: Secondary | ICD-10-CM

## 2016-03-29 DIAGNOSIS — R05 Cough: Secondary | ICD-10-CM | POA: Diagnosis not present

## 2016-10-19 ENCOUNTER — Other Ambulatory Visit: Payer: Self-pay | Admitting: Obstetrics & Gynecology

## 2016-10-19 DIAGNOSIS — Z1231 Encounter for screening mammogram for malignant neoplasm of breast: Secondary | ICD-10-CM

## 2016-11-17 ENCOUNTER — Ambulatory Visit
Admission: RE | Admit: 2016-11-17 | Discharge: 2016-11-17 | Disposition: A | Discharge status: Home / Self Care | Payer: Managed Care, Other (non HMO) | Source: Ambulatory Visit | Attending: Obstetrics & Gynecology | Admitting: Obstetrics & Gynecology

## 2016-11-17 ENCOUNTER — Other Ambulatory Visit: Payer: Self-pay | Admitting: Obstetrics & Gynecology

## 2016-11-17 DIAGNOSIS — Z1231 Encounter for screening mammogram for malignant neoplasm of breast: Secondary | ICD-10-CM
